# Patient Record
Sex: Male | Born: 1990 | Race: Black or African American | Hispanic: No | Marital: Single | Smoking: Current some day smoker
Health system: Southern US, Community
[De-identification: ages and names within clinical notes are randomized; demographics above are authoritative.]

---

## 2015-07-31 ENCOUNTER — Emergency Department (HOSPITAL_COMMUNITY)
Admission: EM | Admit: 2015-07-31 | Discharge: 2015-08-01 | Disposition: A | Discharge status: Home / Self Care | Payer: Self-pay | Attending: Emergency Medicine | Admitting: Emergency Medicine

## 2015-07-31 ENCOUNTER — Encounter (HOSPITAL_COMMUNITY): Payer: Self-pay | Admitting: Emergency Medicine

## 2015-07-31 DIAGNOSIS — H6123 Impacted cerumen, bilateral: Secondary | ICD-10-CM | POA: Insufficient documentation

## 2015-07-31 DIAGNOSIS — H6591 Unspecified nonsuppurative otitis media, right ear: Secondary | ICD-10-CM | POA: Insufficient documentation

## 2015-07-31 DIAGNOSIS — Z720 Tobacco use: Secondary | ICD-10-CM | POA: Insufficient documentation

## 2015-07-31 NOTE — ED Provider Notes (Signed)
CSN: [id number]     Arrival date & time 07/31/15  2038 History  This chart was scribed for non-physician practitioner, Arthor Captain, PA-C working with Arby Barrette, MD, by Jarvis Morgan, ED Scribe. This patient was seen in room TR05C/TR05C and the patient's care was started at 10:24 PM.    Chief Complaint  Patient presents with  . Otalgia   The history is provided by the patient. No language interpreter was used.    HPI Comments: Larry Weaver is a 24 y.o. male with no PMHx who presents to the Emergency Department complaining of constant, mild, right otalgia onset 2 days ago. Pt states he was recently at the beach and the otalgia began after swimming in ocean water. Pt denies any drainage from the ear or hearing loss. He has not taken anything for his symptoms. He denies any fever, difficulty swallowing or dental pain   History reviewed. No pertinent past medical history. History reviewed. No pertinent past surgical history. History reviewed. No pertinent family history. Social History  Substance Use Topics  . Smoking status: Current Some Day Smoker  . Smokeless tobacco: None  . Alcohol Use: Yes    Review of Systems  Constitutional: Negative for fever and chills.  HENT: Positive for ear pain (right). Negative for dental problem, ear discharge, hearing loss and trouble swallowing.       Allergies  Review of patient's allergies indicates no known allergies.  Home Medications   Prior to Admission medications   Not on File   Triage Vitals: BP 120/79 mmHg  Pulse 72  Temp(Src) 98.4 F (36.9 C) (Oral)  Resp 16  Ht  (1.778 m)  Wt 202 lb 2 oz (91.683 kg)  BMI 29.00 kg/m2  SpO2 99%  Physical Exam  Constitutional: He is oriented to person, place, and time. He appears well-developed and well-nourished. No distress.  HENT:  Head: Normocephalic and atraumatic.  Right Ear: There is tenderness (with movement of right pinna).  Bilateral cerumen impaction  Eyes:  Conjunctivae and EOM are normal.  Neck: Neck supple. No tracheal deviation present.  Cardiovascular: Normal rate.   Pulmonary/Chest: Effort normal. No respiratory distress.  Musculoskeletal: Normal range of motion.  Neurological: He is alert and oriented to person, place, and time.  Skin: Skin is warm and dry.  Psychiatric: He has a normal mood and affect. His behavior is normal.  Nursing note and vitals reviewed.   ED Course  EAR CERUMEN REMOVAL Date/Time: 08/01/2015 12:02 PM Performed by: Arthor Captain Authorized by: Arthor Captain Consent: Verbal consent obtained. Risks and benefits: risks, benefits and alternatives were discussed Consent given by: patient Patient identity confirmed: provided demographic data Time out: Immediately prior to procedure a "time out" was called to verify the correct patient, procedure, equipment, support staff and site/side marked as required. Local anesthetic: none Location details: right ear Procedure type: curette and irrigation Patient sedated: no Patient tolerance: Patient tolerated the procedure well with no immediate complications  EAR CERUMEN REMOVAL Date/Time: 08/01/2015 12:03 PM Performed by: Arthor Captain Authorized by: Arthor Captain Consent: Verbal consent obtained. Risks and benefits: risks, benefits and alternatives were discussed Consent given by: patient Patient identity confirmed: provided demographic data Time out: Immediately prior to procedure a "time out" was called to verify the correct patient, procedure, equipment, support staff and site/side marked as required. Local anesthetic: none Location details: left ear Procedure type: curette and irrigation Patient tolerance: Patient tolerated the procedure well with no immediate complications   (including critical care  time)  .DIAGNOSTIC STUDIES: Oxygen Saturation is 99% on RA, normal by my interpretation.    COORDINATION OF CARE:    Labs Review Labs Reviewed -  No data to display  Imaging Review No results found. I have personally reviewed and evaluated these images and lab results as part of my medical decision-making.   EKG Interpretation None      MDM   Final diagnoses:  None    Patient with BL cerumen impactions. Pain in the R side. Apparent serous air fluid levels behind R TM. WIll treat for OME. No mastoid tenderness or fevers   I personally performed the services described in this documentation, which was scribed in my presence. The recorded information has been reviewed and is accurate.       Arthor Captain, PA-C 08/01/15 1205  Arby Barrette, MD 08/05/15 2206

## 2015-07-31 NOTE — ED Notes (Signed)
Patient here with complaint of right ear pain starting about 2 days ago. States recent trip to the beach and possible contamination of ear with sea water. Denies fever. Hearing intact. Denies drainage.

## 2015-08-01 MED ORDER — AMOXICILLIN 500 MG PO CAPS: 500.0000 mg | ORAL_CAPSULE | Freq: Three times a day (TID) | ORAL | Status: DC

## 2015-08-01 MED ORDER — TRAMADOL HCL 50 MG PO TABS: 50.0000 mg | ORAL_TABLET | Freq: Four times a day (QID) | ORAL | Status: DC | PRN

## 2015-08-01 NOTE — Discharge Instructions (Signed)
Cerumen Impaction °A cerumen impaction is when the wax in your ear forms a plug. This plug usually causes reduced hearing. Sometimes it also causes an earache or dizziness. Removing a cerumen impaction can be difficult and painful. The wax sticks to the ear canal. The canal is sensitive and bleeds easily. If you try to remove a heavy wax buildup with a cotton tipped swab, you may push it in further. °Irrigation with water, suction, and small ear curettes may be used to clear out the wax. If the impaction is fixed to the skin in the ear canal, ear drops may be needed for a few days to loosen the wax. People who build up a lot of wax frequently can use ear wax removal products available in your local drugstore. °SEEK MEDICAL CARE IF:  °You develop an earache, increased hearing loss, or marked dizziness. °Document Released: 01/04/2005 Document Revised: 02/19/2012 Document Reviewed: 02/24/2010 °ExitCare® Patient Information ©2015 [employer]. This information is not intended to replace advice given to you by your health care provider. Make sure you discuss any questions you have with your health care provider. °Otitis Media °Otitis media is redness, soreness, and inflammation of the middle ear. Otitis media may be caused by allergies or, most commonly, by infection. Often it occurs as a complication of the common cold. °SIGNS AND SYMPTOMS °Symptoms of otitis media may include: °· Earache. °· Fever. °· Ringing in your ear. °· Headache. °· Leakage of fluid from the ear. °DIAGNOSIS °To diagnose otitis media, your health care provider will examine your ear with an otoscope. This is an instrument that allows your health care provider to see into your ear in order to examine your eardrum. Your health care provider also will ask you questions about your symptoms. °TREATMENT  °Typically, otitis media resolves on its own within 3-5 days. Your health care provider may prescribe medicine to ease your symptoms of pain. If otitis  media does not resolve within 5 days or is recurrent, your health care provider may prescribe antibiotic medicines if he or she suspects that a bacterial infection is the cause. °HOME CARE INSTRUCTIONS  °· If you were prescribed an antibiotic medicine, finish it all even if you start to feel better. °· Take medicines only as directed by your health care provider. °· Keep all follow-up visits as directed by your health care provider. °SEEK MEDICAL CARE IF: °· You have otitis media only in one ear, or bleeding from your nose, or both. °· You notice a lump on your neck. °· You are not getting better in 3-5 days. °· You feel worse instead of better. °SEEK IMMEDIATE MEDICAL CARE IF:  °· You have pain that is not controlled with medicine. °· You have swelling, redness, or pain around your ear or stiffness in your neck. °· You notice that part of your face is paralyzed. °· You notice that the bone behind your ear (mastoid) is tender when you touch it. °MAKE SURE YOU:  °· Understand these instructions. °· Will watch your condition. °· Will get help right away if you are not doing well or get worse. °Document Released: 09/01/2004 Document Revised: 04/13/2014 Document Reviewed: 06/24/2013 °ExitCare® Patient Information ©2015 [employer]. This information is not intended to replace advice given to you by your health care provider. Make sure you discuss any questions you have with your health care provider. ° °

## 2018-10-14 ENCOUNTER — Emergency Department (HOSPITAL_COMMUNITY): Payer: Self-pay

## 2018-10-14 ENCOUNTER — Encounter (HOSPITAL_COMMUNITY): Payer: Self-pay | Admitting: Emergency Medicine

## 2018-10-14 ENCOUNTER — Emergency Department (HOSPITAL_COMMUNITY)
Admission: EM | Admit: 2018-10-14 | Discharge: 2018-10-14 | Disposition: A | Discharge status: Home / Self Care | Payer: Self-pay | Attending: Emergency Medicine | Admitting: Emergency Medicine

## 2018-10-14 DIAGNOSIS — F17210 Nicotine dependence, cigarettes, uncomplicated: Secondary | ICD-10-CM | POA: Insufficient documentation

## 2018-10-14 DIAGNOSIS — R0789 Other chest pain: Secondary | ICD-10-CM | POA: Insufficient documentation

## 2018-10-14 LAB — CBC
HCT: 41.8 % (ref 39.0–52.0)
HEMOGLOBIN: 13.4 g/dL (ref 13.0–17.0)
MCH: 27.3 pg (ref 26.0–34.0)
MCHC: 32.1 g/dL (ref 30.0–36.0)
MCV: 85.1 fL (ref 80.0–100.0)
NRBC: 0 % (ref 0.0–0.2)
Platelets: 282 10*3/uL (ref 150–400)
RBC: 4.91 MIL/uL (ref 4.22–5.81)
RDW: 12.9 % (ref 11.5–15.5)
WBC: 5 10*3/uL (ref 4.0–10.5)

## 2018-10-14 LAB — BASIC METABOLIC PANEL
ANION GAP: 7 (ref 5–15)
BUN: 11 mg/dL (ref 6–20)
CHLORIDE: 105 mmol/L (ref 98–111)
CO2: 28 mmol/L (ref 22–32)
Calcium: 8.7 mg/dL — ABNORMAL LOW (ref 8.9–10.3)
Creatinine, Ser: 0.94 mg/dL (ref 0.61–1.24)
GFR calc non Af Amer: >60 mL/min (ref >60)
Glucose, Bld: 93 mg/dL (ref 70–99)
POTASSIUM: 3.5 mmol/L (ref 3.5–5.1)
Sodium: 140 mmol/L (ref 135–145)

## 2018-10-14 LAB — I-STAT TROPONIN, ED: TROPONIN I, POC: 0 ng/mL (ref 0.00–0.08)

## 2018-10-14 NOTE — ED Provider Notes (Signed)
Patient placed in Quick Look pathway, seen and evaluated   Chief Complaint: chest pain  HPI: Larry Weaver is a 27 y.o. male who presents to the ED with chest pain. Patient reports that he has had episodes off and on for about 5 months but today he was walking home and developed pain in the mid chest that was worse than usual.  ROS: CV: chest pain  Physical Exam:  BP 138/80 (BP Location: Right Arm)   Pulse 82   Temp 97.6 F (36.4 C) (Oral)   Resp 18   SpO2 100%    Gen: No distress  Neuro: Awake and Alert  Skin: Warm and dry  Heart: regular rate and rhythm  Lungs: clear   Initiation of care has begun. The patient has been counseled on the process, plan, and necessity for staying for the completion/evaluation, and the remainder of the medical screening examination    Larry Napoleon, NP 10/14/18 1707    Loren Racer, MD 10/16/18 [phone]

## 2018-10-14 NOTE — ED Triage Notes (Addendum)
Per [facility]: Patient to ED c/o intermittent central, non-radiating chest pain x 1 hour - states "I've had this before over the past 5-6 months, but it has always come and gone and today it felt sharp." No PCP or known significant medical history. Was walking at time of onset. Currently denies chest pain, no shortness of breath, dizziness, nausea/vomiting. Resp e/u, skin warm/dry.  EMS VS: 127/66, HR 80 NSR, 98% RA. 20g. PIV LAC.

## 2018-10-14 NOTE — ED Provider Notes (Signed)
[facility] EMERGENCY DEPARTMENT Provider Note   CSN: [id number] Arrival date & time: 10/14/18  1628     History   Chief Complaint Chief Complaint  Patient presents with  . Chest Pain    HPI Larry Weaver is a 27 y.o. male.  HPI   27 year old male presents today with complaints of chest tightness.  Patient notes he was walking home when he developed tightness in his anterior chest.  He denies any significant pain, reports he had shortness of breath with this.  He notes this lasted approximately 10 minutes and then resolved completely.  He denies any associated diaphoresis, denies any history DVT or PE or any significant risk factors.  He denies any ACS history or significant risk factors.  No history of asthma.   History reviewed. No pertinent past medical history.  There are no active problems to display for this patient.   History reviewed. No pertinent surgical history.      Home Medications    Prior to Admission medications   Medication Sig Start Date End Date Taking? Authorizing Provider  amoxicillin (AMOXIL) 500 MG capsule Take 1 capsule (500 mg total) by mouth 3 (three) times daily. 08/01/15   Arthor Captain, PA-C  traMADol (ULTRAM) 50 MG tablet Take 1 tablet (50 mg total) by mouth every 6 (six) hours as needed. 08/01/15   Arthor Captain, PA-C    Family History No family history on file.  Social History Social History   Tobacco Use  . Smoking status: Current Some Day Smoker    Types: Cigarettes  Substance Use Topics  . Alcohol use: Yes  . Drug use: No     Allergies   Patient has no known allergies.   Review of Systems Review of Systems  All other systems reviewed and are negative.    Physical Exam Updated Vital Signs BP (!) 149/106 (BP Location: Right Arm)   Pulse (!) 119   Temp 97.6 F (36.4 C) (Oral)   Resp 18   SpO2 100%   Physical Exam  Constitutional: He is oriented to person, place, and time. He appears  well-developed and well-nourished.  HENT:  Head: Normocephalic and atraumatic.  Eyes: Pupils are equal, round, and reactive to light. Conjunctivae are normal. Right eye exhibits no discharge. Left eye exhibits no discharge. No scleral icterus.  Neck: Normal range of motion. No JVD present. No tracheal deviation present.  Cardiovascular: Normal rate, regular rhythm, normal heart sounds and intact distal pulses. Exam reveals no gallop and no friction rub.  No murmur heard. Pulmonary/Chest: Effort normal and breath sounds normal. No stridor. No respiratory distress. He has no wheezes. He has no rales.  Musculoskeletal: He exhibits no edema.  Neurological: He is alert and oriented to person, place, and time. Coordination normal.  Psychiatric: He has a normal mood and affect. His behavior is normal. Judgment and thought content normal.  Nursing note and vitals reviewed.    ED Treatments / Results  Labs (all labs ordered are listed, but only abnormal results are displayed) Labs Reviewed  BASIC METABOLIC PANEL - Abnormal; Notable for the following components:      Result Value   Calcium 8.7 (*)    All other components within normal limits  CBC  I-STAT TROPONIN, ED    EKG None  Radiology Dg Chest 2 View  Result Date: 10/14/2018 CLINICAL DATA:  Chest pain. EXAM: CHEST - 2 VIEW COMPARISON:  None. FINDINGS: The heart size and mediastinal contours are within  normal limits. Both lungs are clear. The visualized skeletal structures are unremarkable. IMPRESSION: Normal exam. Electronically Signed   By: Francene Boyers M.D.   On: 10/14/2018 17:15    Procedures Procedures (including critical care time)  Medications Ordered in ED Medications - No data to display   Initial Impression / Assessment and Plan / ED Course  I have reviewed the triage vital signs and the nursing notes.  Pertinent labs & imaging results that were available during my care of the patient were reviewed by me and  considered in my medical decision making (see chart for details).     Labs: I-STAT troponin, BMP, CBC  Imaging: DG chest 2 view  Consults:  Therapeutics:  Discharge Meds:   Assessment/Plan: 27 year old male presents today with complaints of chest tightness.  This was a brief episode lasting approximately 10 minutes with complete resolution.  He is asymptomatic at the time my evaluation.  His heart rate was 82 in no acute distress.  He has notes significant risk factors or findings consistent with ACS, PE, or any acute pathology.  Discharged with outpatient follow-up and strict return precautions he verbalized understanding and agreement to today's plan.      Final Clinical Impressions(s) / ED Diagnoses   Final diagnoses:  Chest tightness    ED Discharge Orders    None       Rosalio Loud 10/14/18 2215    Tilden Fossa, MD 10/15/18 1422

## 2018-10-14 NOTE — Discharge Instructions (Addendum)
Please read attached information. If you experience any new or worsening signs or symptoms please return to the emergency room for evaluation. Please follow-up with your primary care provider or specialist as discussed.  °

## 2019-03-16 ENCOUNTER — Emergency Department (HOSPITAL_BASED_OUTPATIENT_CLINIC_OR_DEPARTMENT_OTHER)
Admission: EM | Admit: 2019-03-16 | Discharge: 2019-03-16 | Disposition: A | Discharge status: Home / Self Care | Payer: No Typology Code available for payment source | Attending: Emergency Medicine | Admitting: Emergency Medicine

## 2019-03-16 ENCOUNTER — Other Ambulatory Visit: Payer: Self-pay

## 2019-03-16 ENCOUNTER — Encounter (HOSPITAL_BASED_OUTPATIENT_CLINIC_OR_DEPARTMENT_OTHER): Payer: Self-pay

## 2019-03-16 DIAGNOSIS — F17210 Nicotine dependence, cigarettes, uncomplicated: Secondary | ICD-10-CM | POA: Insufficient documentation

## 2019-03-16 DIAGNOSIS — Z23 Encounter for immunization: Secondary | ICD-10-CM | POA: Diagnosis not present

## 2019-03-16 DIAGNOSIS — M79645 Pain in left finger(s): Secondary | ICD-10-CM | POA: Diagnosis present

## 2019-03-16 MED ORDER — CEPHALEXIN 500 MG PO CAPS: 500.0000 mg | ORAL_CAPSULE | Freq: Three times a day (TID) | ORAL | 0 refills | Status: AC

## 2019-03-16 MED ORDER — TETANUS-DIPHTH-ACELL PERTUSSIS 5-2.5-18.5 LF-MCG/0.5 IM SUSP
0.5000 mL | Freq: Once | INTRAMUSCULAR | Status: AC
  Administered 2019-03-16: 18:00:00 0.5 mL via INTRAMUSCULAR
  Filled 2019-03-16: qty 0.5

## 2019-03-16 NOTE — ED Triage Notes (Signed)
Pt presents to the ED with left 1st digit numbness/ discoloration. 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

## 2019-03-16 NOTE — ED Notes (Signed)
ED Provider at bedside. 

## 2019-03-16 NOTE — Discharge Instructions (Signed)
As we discussed, you will need to apply warm compresses to the finger 2-3 times a day.  Take antibiotics as directed. Please take all of your antibiotics until finished.  Follow-up with referred hand doctor.  Return to emergency department for any worsening pain, fever, redness or swelling of the finger, discoloration of the finger or any other worsening or concerning symptoms.

## 2019-03-16 NOTE — ED Provider Notes (Signed)
[facility] EMERGENCY DEPARTMENT Provider Note   CSN: [id number] Arrival date & time: 03/16/19  1627    History   Chief Complaint No chief complaint on file.   HPI Larry Weaver is a 28 y.o. male resents for evaluation of left index finger pain, discoloration x2 days.  Patient reports that about 2 days ago, he noticed he was having pain to the distal tip of his left index finger.  He states that he looked and he saw a slight area of discoloration to the distal tip and was concerned.  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  He wears 2 pairs of gloves.  He denies any preceding trauma, injury, fall.  Patient states that he can feel the distal tip but states he has some decrease sensation.  He has not noted any fevers.  Unsure of his last tetanus.     The history is provided by the patient.    History reviewed. No pertinent past medical history.  There are no active problems to display for this patient.   History reviewed. No pertinent surgical history.      Home Medications    Prior to Admission medications   Medication Sig Start Date End Date Taking? Authorizing Provider  amoxicillin (AMOXIL) 500 MG capsule Take 1 capsule (500 mg total) by mouth 3 (three) times daily. 08/01/15   Arthor Captain, PA-C  cephALEXin (KEFLEX) 500 MG capsule Take 1 capsule (500 mg total) by mouth 3 (three) times daily for 7 days. 03/16/19 03/23/19  Maxwell Caul, PA-C  traMADol (ULTRAM) 50 MG tablet Take 1 tablet (50 mg total) by mouth every 6 (six) hours as needed. 08/01/15   Arthor Captain, PA-C    Family History No family history on file.  Social History Social History   Tobacco Use   Smoking status: Current Some Day Smoker    Types: Cigarettes  Substance Use Topics   Alcohol use: Yes   Drug use: No     Allergies   Patient has no known allergies.   Review of Systems Review of Systems    Constitutional: Negative for fever.  Skin: Positive for color change.  Neurological: Positive for numbness.  All other systems reviewed and are negative.    Physical Exam Updated Vital Signs BP 134/84 (BP Location: Right Arm)    Pulse 90    Temp 98.7 F (37.1 C) (Oral)    Resp 17    Ht 5\' 10"  (1.778 m)    Wt 90.7 kg    SpO2 99%    BMI 28.70 kg/m   Physical Exam Vitals signs and nursing note reviewed.  Constitutional:      Appearance: He is well-developed.  HENT:     Head: Normocephalic and atraumatic.  Eyes:     General: No scleral icterus.       Right eye: No discharge.        Left eye: No discharge.     Conjunctiva/sclera: Conjunctivae normal.  Cardiovascular:     Pulses:          Radial pulses are 2+ on the right side and 2+ on the left side.  Pulmonary:     Effort: Pulmonary effort is normal.  Musculoskeletal:     Comments: Full Range of motion of all 5 digits of left hand without any difficulty.  Flexion/tension of all 5 digits intact  any difficulty.  Skin:    General: Skin is warm and dry.     Capillary Refill: Capillary refill takes less than 2 seconds.     Comments: Light area of discoloration noted to the distal tip of the left index finger.  No surrounding warmth, erythema.  No black discoloration.  Cap refill intact.  It is not dusky in appearance or cool to touch.  Neurological:     Mental Status: He is alert.  Psychiatric:        Speech: Speech normal.        Behavior: Behavior normal.         ED Treatments / Results  Labs (all labs ordered are listed, but only abnormal results are displayed) Labs Reviewed - No data to display  EKG None  Radiology No results found.  Procedures Procedures (including critical care time)  Medications Ordered in ED Medications  Tdap (BOOSTRIX) injection 0.5 mL (0.5 mLs Intramuscular Given 03/16/19 1807)     Initial Impression / Assessment and Plan / ED Course  I have reviewed the triage vital signs and the  nursing notes.  Pertinent labs & imaging results that were available during my care of the patient were reviewed by me and considered in my medical decision making (see chart for details).        28 year old male who presents for evaluation of left index pain x2 days.  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  He noticed some discoloration to it 2 days ago.  No fevers. He reports some slight decrease sensation to the distal tip. Vital signs reviewed and stable.  Patient is afebrile, non-toxic appearing, sitting comfortably on examination table.  Patient with good distal cap refill.  Finger does not appear dusky in appearance or cool to touch.  He does have a small area of discoloration noted to the distal tip with some decrease sensation but states he can feel me pressing on him.  No surrounding warmth, erythema.  We will plan to update tetanus.  Additionally, plan to put him on antibiotics for preventative infection.  Instructed patient to do warm compresses to help with rewarming.  Additionally, will give him outpatient referral to hand doctor for further follow-up. Discussed patient with Dr. Adela Lank who agrees with plan. At this time, patient exhibits no emergent life-threatening condition that require further evaluation in ED or admission. Patient had ample opportunity for questions and discussion. All patient's questions were answered with full understanding. Strict return precautions discussed. Patient expresses understanding and agreement to plan.   Portions of this note were generated with Scientist, clinical (histocompatibility and immunogenetics). Dictation errors may occur despite best attempts at proofreading.   Final Clinical Impressions(s) / ED Diagnoses   Final diagnoses:  Pain of finger of left hand    ED Discharge Orders         Ordered    cephALEXin (KEFLEX) 500 MG capsule  3 times daily     03/16/19 1754           Rosana Hoes 03/16/19 2214    Melene Plan, DO 03/16/19 2246

## 2019-05-02 ENCOUNTER — Emergency Department (HOSPITAL_COMMUNITY)
Admission: EM | Admit: 2019-05-02 | Discharge: 2019-05-02 | Disposition: A | Discharge status: Home / Self Care | Payer: Self-pay | Attending: Emergency Medicine | Admitting: Emergency Medicine

## 2019-05-02 ENCOUNTER — Other Ambulatory Visit: Payer: Self-pay

## 2019-05-02 DIAGNOSIS — T33521A Superficial frostbite of right hand, initial encounter: Secondary | ICD-10-CM

## 2019-05-02 DIAGNOSIS — F17210 Nicotine dependence, cigarettes, uncomplicated: Secondary | ICD-10-CM | POA: Insufficient documentation

## 2019-05-02 DIAGNOSIS — W932XXA Prolonged exposure in deep freeze unit or refrigerator, initial encounter: Secondary | ICD-10-CM | POA: Insufficient documentation

## 2019-05-02 DIAGNOSIS — Y9289 Other specified places as the place of occurrence of the external cause: Secondary | ICD-10-CM | POA: Insufficient documentation

## 2019-05-02 DIAGNOSIS — Y939 Activity, unspecified: Secondary | ICD-10-CM | POA: Insufficient documentation

## 2019-05-02 DIAGNOSIS — T33531A Superficial frostbite of right finger(s), initial encounter: Secondary | ICD-10-CM | POA: Insufficient documentation

## 2019-05-02 DIAGNOSIS — Y990 Civilian activity done for income or pay: Secondary | ICD-10-CM | POA: Insufficient documentation

## 2019-05-02 NOTE — ED Triage Notes (Signed)
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 Pt reports that about three days ago he developed "pins and needles" in his right index and second digit. Pt reports it has continued since then. States that it is a 3/10 tingling pain at present time. Whitish discoloration noted to right index and second digit. Pt does report sensation in both fingers. Pt reports he does wear two pairs of gloves when he works.
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
## 2019-05-02 NOTE — Discharge Instructions (Signed)
Take ibuprofen every 6 hours as needed for pain. 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 Schedule an appointment with the hand specialist for follow up and further management.

## 2019-10-09 ENCOUNTER — Emergency Department (HOSPITAL_COMMUNITY): Payer: Self-pay

## 2019-10-09 ENCOUNTER — Encounter (HOSPITAL_COMMUNITY): Payer: Self-pay | Admitting: Emergency Medicine

## 2019-10-09 ENCOUNTER — Emergency Department (HOSPITAL_COMMUNITY)
Admission: EM | Admit: 2019-10-09 | Discharge: 2019-10-09 | Disposition: A | Discharge status: Home / Self Care | Payer: Self-pay | Attending: Emergency Medicine | Admitting: Emergency Medicine

## 2019-10-09 DIAGNOSIS — Y999 Unspecified external cause status: Secondary | ICD-10-CM | POA: Insufficient documentation

## 2019-10-09 DIAGNOSIS — R0789 Other chest pain: Secondary | ICD-10-CM | POA: Diagnosis present

## 2019-10-09 DIAGNOSIS — Y93I9 Activity, other involving external motion: Secondary | ICD-10-CM | POA: Insufficient documentation

## 2019-10-09 DIAGNOSIS — Y92410 Unspecified street and highway as the place of occurrence of the external cause: Secondary | ICD-10-CM | POA: Insufficient documentation

## 2019-10-09 DIAGNOSIS — F17210 Nicotine dependence, cigarettes, uncomplicated: Secondary | ICD-10-CM | POA: Insufficient documentation

## 2019-10-09 MED ORDER — KETOROLAC TROMETHAMINE 15 MG/ML IJ SOLN
15.0000 mg | Freq: Once | INTRAMUSCULAR | Status: AC
  Administered 2019-10-09: 15 mg via INTRAMUSCULAR
  Filled 2019-10-09: qty 1
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
## 2019-10-09 NOTE — ED Notes (Signed)
Patient verbalizes understanding of discharge instructions. Opportunity for questioning and answers were provided. Armband removed by staff, pt discharged from ED ambulatory w/ SO 
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
